# Patient Record
Sex: Female | Born: 1980 | Race: White | Hispanic: No | Marital: Married | Smoking: Former smoker
Health system: Southern US, Community
[De-identification: ages and names within clinical notes are randomized; demographics above are authoritative.]

## PROBLEM LIST (undated history)

## (undated) ENCOUNTER — Inpatient Hospital Stay: Payer: Self-pay

## (undated) DIAGNOSIS — Z789 Other specified health status: Secondary | ICD-10-CM

## (undated) HISTORY — PX: EYE SURGERY: SHX253

## (undated) HISTORY — PX: LEEP: SHX91

---

## 2009-02-24 ENCOUNTER — Encounter: Admission: RE | Admit: 2009-02-24 | Discharge: 2009-02-24 | Payer: Self-pay | Admitting: Obstetrics and Gynecology

## 2012-05-15 ENCOUNTER — Ambulatory Visit: Payer: Self-pay | Admitting: Unknown Physician Specialty

## 2012-07-24 ENCOUNTER — Ambulatory Visit: Payer: Self-pay | Admitting: Obstetrics & Gynecology

## 2013-02-17 ENCOUNTER — Observation Stay: Payer: Self-pay

## 2013-03-18 ENCOUNTER — Inpatient Hospital Stay: Payer: Self-pay

## 2013-03-18 ENCOUNTER — Encounter: Payer: Self-pay | Admitting: Obstetrics and Gynecology

## 2013-03-18 LAB — CBC WITH DIFFERENTIAL/PLATELET
Basophil #: 0 10*3/uL (ref 0.0–0.1)
Eosinophil #: 0 10*3/uL (ref 0.0–0.7)
Eosinophil %: 0 %
HGB: 12.8 g/dL (ref 12.0–16.0)
MCH: 30.8 pg (ref 26.0–34.0)
Monocyte #: 1.2 x10 3/mm — ABNORMAL HIGH (ref 0.2–0.9)
Monocyte %: 6.6 %
Neutrophil %: 89 %
RBC: 4.16 10*6/uL (ref 3.80–5.20)
WBC: 18.4 10*3/uL — ABNORMAL HIGH (ref 3.6–11.0)

## 2015-05-09 NOTE — H&P (Signed)
L&D Evaluation:  History:  HPI 34yo G1 at 7538w0d with didi twins in precipitous preterm labor.  Twin A is female and has been transverse, Twin B is female and has been transverse. [facility] for [facility]. Most recent U/S ~5wks ago and showed baby B 3oz larger than baby A and each around 2lbs (all per pt report).   Presents with contractions   Patient's Medical History Obesity, Cervical dysplasia, allergic rhinitis   Patient's Surgical History LEEP  colposcopy, LASIK   Medications Pre Natal Vitamins  Zyrtec, nasal sprays (Rhinocort)   Allergies NKDA   Social History none  Former smoker   Family History Non-Contributory   ROS:  ROS All systems were reviewed.  HEENT, CNS, GI, GU, Respiratory, CV, Renal and Musculoskeletal systems were found to be normal., see HPI   Exam:  Vital Signs stable   Urine Protein not completed   General no apparent distress   Mental Status clear   Chest normal effort   Abdomen gravid, non-tender   Fetal Position Twin A cephalic   Edema +1 to +2 edema in LE   Pelvic no external lesions, C/C/+4   Mebranes Ruptured   Description clear   FHT +FHTs for both babies prior to delivery   Ucx every 1-2 min   Skin dry   Impression:  Impression G1 at 5738w0d with didi twins in precip preterm labor.   Plan:  Comments Quick discussion with pt re: risks/benefits of vaginal delivery (including breech extraction of twin B if indicated) vs. C/S.  Anesthesia not readily present for precipitous nature of delivery.  Mutual decision made to proceed with vaginal delivery and pt moved to OR for double set up.   Electronic Signatures: Garnette GunnerStansbury Clipp, Ali LoweEryn K (MD)  (Signed 20-Mar-14 04:57)  Authored: L&D Evaluation   Last Updated: 20-Mar-14 04:57 by Garnette GunnerStansbury Clipp, Ali LoweEryn K (MD)

## 2015-05-09 NOTE — H&P (Signed)
L&D Evaluation:  History:  HPI 34 year old G1 p0 with DiDi twins and EDC=05/21/2013 presents at 5826 4/7 weeks with c/o LOF x3 days and decreased FM of twin A today. LOF increased today-has changed 1 panntiliner today. Had some irritation with discharge last night. No bleeding. Twin A is female and has been transverse, Twin B is female and has been transverse. [facility] notable for obesity. Has recently seen DP and ultrasound last week showed concordant growth of twins.   Presents with decreased fetal movement, leaking fluid   Patient's Medical History Obesity, Cervical dysplasia, allergic rhinitis   Patient's Surgical History LEEP  colposcopy, LASIK   Medications Pre Natal Vitamins  Zyrtec, nasal sprays (Rhinocort)   Allergies NKDA   Social History none  Former smoker   Family History Non-Contributory   ROS:  ROS see HPI   Exam:  Vital Signs 139/73   Urine Protein not completed   General no apparent distress   Mental Status clear   Abdomen gravid, non-tender   Fetal Position Twin A-cephalic, Twin B-transverse (superior)   Edema +1 to +2 edema in LE   Pelvic no external lesions, SSE: negative Nitrazine, neg fern. Wet prep negative.   Mebranes Intact   FHT Twin A-RLQ-135 with accels to 150s. Twin B -above umbilicus-140s baseline with accels to 160s   FHT Description mod variability   Ucx initially frequent q1-3 min apart, then contractions spaced out with po hydration. Patient did not feel the contractions   Skin dry   Other SDPTWIN A=4.38cm, Twin B SDP=6.90cm. FM noted in both twins on US   Impression:  Impression DiDI twins with reactive NST x2 at 26 4/7 weeks. No evidence of PROM or vaginitis.   Plan:  Plan DC home . FU as scheduled at office in 1 week.   Electronic Signatures: Trinna BalloonGutierrez, Everlee Quakenbush L (CNM)  (Signed 20-Feb-14 06:08)  Authored: L&D Evaluation   Last Updated: 20-Feb-14 06:08 by Trinna BalloonGutierrez, Oksana Deberry L (CNM)

## 2015-08-22 ENCOUNTER — Other Ambulatory Visit: Payer: Self-pay | Admitting: Obstetrics and Gynecology

## 2015-08-22 DIAGNOSIS — Z1231 Encounter for screening mammogram for malignant neoplasm of breast: Secondary | ICD-10-CM

## 2015-08-23 ENCOUNTER — Ambulatory Visit
Admission: RE | Admit: 2015-08-23 | Discharge: 2015-08-23 | Disposition: A | Discharge status: Home / Self Care | Payer: Federal, State, Local not specified - PPO | Source: Ambulatory Visit | Attending: Obstetrics and Gynecology | Admitting: Obstetrics and Gynecology

## 2015-08-23 DIAGNOSIS — Z1231 Encounter for screening mammogram for malignant neoplasm of breast: Secondary | ICD-10-CM | POA: Insufficient documentation

## 2015-12-31 NOTE — L&D Delivery Note (Signed)
Delivery Note Patient's last menstrual period was 01/06/2016. EGA: 40.0  At 3:23 AM a viable female was delivered via Vaginal, Spontaneous Delivery (Presentation: ROA;).  APGAR: 8, 9; weight 8 lb 1.1 oz (3660 g).   Placenta status: spontaneous, intact.  Cord: 3 vessels,  with the following complications: none apparent.  Cord pH: n/a  Anesthesia:  epidural Episiotomy:  none Lacerations:  Right labial Suture Repair: 3.0 vicryl Est. Blood Loss (mL):  200cc  Mom to postpartum.  Baby to Couplet care / Skin to Skin.  [facility], s/p cytotecx 2 then AROM.  Epidural placed then rapid dilation/descent and minutes-long 2nd stage while bed was being set up.  One push.  Loose body cord.  Baby was delivered and placed on mom's chest.  Cord left intact until pulseless, then clamped then cut by FOB.  Cord blood collected. Spontaneous intact placenta. Right superficial labial tear, repaired with 3-0 vicryl and hemostatic.    We sang Happy [place] OuchBirthday to this as-of-yet unnamed baby girl.  Jacquese Hackman C Taje Tondreau 10/12/2016, 4:01 AM

## 2016-03-14 ENCOUNTER — Other Ambulatory Visit: Payer: Self-pay | Admitting: Obstetrics and Gynecology

## 2016-03-14 DIAGNOSIS — Z369 Encounter for antenatal screening, unspecified: Secondary | ICD-10-CM

## 2016-04-04 ENCOUNTER — Ambulatory Visit (HOSPITAL_BASED_OUTPATIENT_CLINIC_OR_DEPARTMENT_OTHER)
Admission: RE | Admit: 2016-04-04 | Discharge: 2016-04-04 | Disposition: A | Discharge status: Home / Self Care | Payer: Federal, State, Local not specified - PPO | Source: Ambulatory Visit | Attending: Maternal & Fetal Medicine | Admitting: Maternal & Fetal Medicine

## 2016-04-04 ENCOUNTER — Ambulatory Visit
Admission: RE | Admit: 2016-04-04 | Discharge: 2016-04-04 | Disposition: A | Discharge status: Home / Self Care | Payer: Federal, State, Local not specified - PPO | Source: Ambulatory Visit | Attending: Maternal & Fetal Medicine | Admitting: Maternal & Fetal Medicine

## 2016-04-04 VITALS — BP 121/77 | HR 87 | Temp 98.2°F | Resp 18 | Wt 234.4 lb

## 2016-04-04 DIAGNOSIS — N83201 Unspecified ovarian cyst, right side: Secondary | ICD-10-CM | POA: Diagnosis not present

## 2016-04-04 DIAGNOSIS — O09521 Supervision of elderly multigravida, first trimester: Secondary | ICD-10-CM | POA: Diagnosis not present

## 2016-04-04 DIAGNOSIS — O3481 Maternal care for other abnormalities of pelvic organs, first trimester: Secondary | ICD-10-CM | POA: Diagnosis not present

## 2016-04-04 DIAGNOSIS — Z3A12 12 weeks gestation of pregnancy: Secondary | ICD-10-CM | POA: Insufficient documentation

## 2016-04-04 DIAGNOSIS — Z369 Encounter for antenatal screening, unspecified: Secondary | ICD-10-CM

## 2016-04-04 DIAGNOSIS — O09529 Supervision of elderly multigravida, unspecified trimester: Secondary | ICD-10-CM | POA: Insufficient documentation

## 2016-04-04 DIAGNOSIS — Z36 Encounter for antenatal screening of mother: Secondary | ICD-10-CM | POA: Insufficient documentation

## 2016-04-04 DIAGNOSIS — O09511 Supervision of elderly primigravida, first trimester: Secondary | ICD-10-CM | POA: Insufficient documentation

## 2016-04-04 HISTORY — DX: Other specified health status: Z78.9

## 2016-04-04 NOTE — Progress Notes (Signed)
I agree with assessment and plan as outlined in CGC Wells's note.  

## 2016-04-04 NOTE — Progress Notes (Signed)
Referring Provider:   [facility] Length of Consultation: 50 minutes  Ms. Leanna BattlesMalone was [facility] for genetic counseling because of advanced maternal age.  The patient will be 35 years old at the time of delivery.  This note summarizes the information we discussed.    We explained that the chance of a chromosome abnormality increases with maternal age.  Chromosomes and examples of chromosome problems were reviewed.  Humans typically have 46 chromosomes in each cell, with half passed through each sperm and egg.  Any change in the number or structure of chromosomes can increase the risk of problems in the physical and mental development of a pregnancy.   Based upon age of the patient, the chance of any chromosome abnormality was 1 in 2487. The chance of Down syndrome, the most common chromosome problem associated with maternal age, was 1 in 40175.  The risk of chromosome problems is in addition to the 3% general population risk for birth defects and mental retardation.  The greatest chance, of course, is that the baby would be born in good health.  We discussed the following prenatal screening and testing options for this pregnancy:  First trimester screening, which includes nuchal translucency ultrasound screen and first trimester maternal serum marker screening.  The nuchal translucency has approximately an 80% detection rate for Down syndrome and can be positive for other chromosome abnormalities as well as heart defects.  When combined with a maternal serum marker screening, the detection rate is up to 90% for Down syndrome and up to 97% for trisomy 18.     The chorionic villus sampling procedure is available for first trimester chromosome analysis.  This involves the withdrawal of a small amount of chorionic villi (tissue from the developing placenta).  Risk of pregnancy loss is estimated to be approximately 1 in 200 to 1 in 100 (0.5 to 1%).  There is approximately  a 1% (1 in 100) chance that the CVS chromosome results will be unclear.  Chorionic villi cannot be tested for neural tube defects.     Maternal serum marker screening, a blood test that measures pregnancy proteins, can provide risk assessments for Down syndrome, trisomy 18, and open neural tube defects (spina bifida, anencephaly). Because it does not directly examine the fetus, it cannot positively diagnose or rule out these problems.  Targeted ultrasound uses high frequency sound waves to create an image of the developing fetus.  An ultrasound is often recommended as a routine means of evaluating the pregnancy.  It is also used to screen for fetal anatomy problems (for example, a heart defect) that might be suggestive of a chromosomal or other abnormality.   Amniocentesis involves the removal of a small amount of amniotic fluid from the sac surrounding the fetus with the use of a thin needle inserted through the maternal abdomen and uterus.  Ultrasound guidance is used throughout the procedure.  Fetal cells from amniotic fluid are directly evaluated and > 99.5% of chromosome problems and > 98% of open neural tube defects can be detected. This procedure is generally performed after the 15th week of pregnancy.  The main risks to this procedure include complications leading to miscarriage in less than 1 in 200 cases (0.5%).  We also reviewed the availability of cell free fetal DNA testing from maternal blood to determine whether or not the baby may have either Down syndrome, trisomy 5213, or trisomy 7818.  This test utilizes a maternal blood sample and DNA  sequencing technology to isolate circulating cell free fetal DNA from maternal plasma.  The fetal DNA can then be analyzed for DNA sequences that are derived from the three most common chromosomes involved in aneuploidy, chromosomes 13, 18, and 21.  If the overall amount of DNA is greater than the expected level for any of these chromosomes, aneuploidy is  suspected.  While we do not consider it a replacement for invasive testing and karyotype analysis, a negative result from this testing would be reassuring, though not a guarantee of a normal chromosome complement for the baby.  An abnormal result is certainly suggestive of an abnormal chromosome complement, though we would still recommend CVS or amniocentesis to confirm any findings from this testing.  Cystic Fibrosis and Spinal Muscular Atrophy (SMA) screening were also discussed with the patient. Both conditions are recessive, which means that both parents must be carriers in order to have a child with the disease.  Cystic fibrosis (CF) is one of the most common genetic conditions in persons of Caucasian ancestry.  This condition occurs in approximately 1 in 2,500 Caucasian persons and results in thickened secretions in the lungs, digestive, and reproductive systems.  For a baby to be at risk for having CF, both of the parents must be carriers for this condition.  Approximately 1 in 45 Caucasian persons is a carrier for CF.  Current carrier testing looks for the most common mutations in the gene for CF and can detect approximately 90% of carriers in the Caucasian population.  This means that the carrier screening can greatly reduce, but cannot eliminate, the chance for an individual to have a child with CF.  If an individual is found to be a carrier for CF, then carrier testing would be available for the partner. As part of [place]'s newborn screening profile, all babies born in the state of [place] will have a two-tier screening process.  Specimens are first tested to determine the concentration of immunoreactive trypsinogen (IRT).  The top 5% of specimens with the highest IRT values then undergo DNA testing using a panel of over 40 common CF mutations. SMA is a neurodegenerative disorder that leads to atrophy of skeletal muscle and overall weakness. SMA is also inherited as a recessive condition,  meaning that both parents must be carriers in order to have a baby with the condition. This condition is also more prevalent in the Caucasian population, with 1 in 40-1 in 60 persons being a carrier and 1 in 6,000-1 in 10,000 children being affected.  There are multiple forms of the disease, with some causing death in infancy and other forms with survival into adulthood.  The genetics of SMA is complex, but carrier screening can detect up to 95% of carriers in the Caucasian population.  Similar to CF, a negative result can greatly reduce, but cannot eliminate, the chance to have a child with SMA.  We obtained a detailed family history and pregnancy history.  Ms. Ramanathan reported that this is her second pregnancy.   She and her husband have twins, a boy and girl, who are three years old and in good health.  They were born at 31 weeks following a very rapid labor.  Both are growing and developing normally.  The patient reported that one of her sisters has a 32 year old daughter with health concerns.  She has a condition in which her body "attacks her muscles" and she has adverse reactions to anesthesia.  She is said to have  been a late walker and be very tall because her muscles do not constrict properly. Ms. Korpela could not recall the name of the condition but felt like the family was told it came from the child's father's family.  We encouraged her to find out more information, particularly the name of the condition, and let us know if she would like to discuss this more.  Without additional information, it is difficult to accurately assess the chance for other family members, including this pregnancy, to be affected with this condition.  Ms. Abrams also reported that her mother and sister both had several miscarriages. Her mother went on to have four healthy children and her sister has 5 children.  The reason for those losses was not known.  We reviewed that there may be many reasons for miscarriage, including  clotting disorders, structural uterine anomalies, chromosome rearrangements and other causes.  Without knowledge of the cause, it is difficult to determine the level of concern for other family members.  Because the patient has not experienced any losses and we do not know the cause for those losses, we would not recommend any additional testing at this time. The remainder of the family history is unremarkable for birth defects, mental retardation, recurrent pregnancy loss or known chromosome abnormalities.  Ms. Heaton reported no complications or exposures that would be expected to increase the risk for birth defects.  After consideration of the options, Ms. Lightsey elected to proceed with an ultrasound, InformaSeq testing, CF and SMA carrier screening.  Labs were drawn today and results should be available within 2 weeks.  An ultrasound was performed at the time of the visit.  The gestational age was consistent with  12 weeks.  Fetal anatomy could not be assessed due to early gestational age.  Please refer to the ultrasound report for details of that study.  Ms. Toelle was encouraged to call with questions or concerns.  We can be contacted at [phone].   Cherly Anderson, MS, CGC

## 2016-04-09 LAB — MISC LABCORP TEST (SEND OUT): Labcorp test code: 450010

## 2016-04-11 LAB — INFORMASEQ(SM) WITH XY ANALYSIS
Fetal Fraction (%):: 6.1
Fetal Number: 1
Gestational Age at Collection: 12.7 weeks
Weight: 234 [lb_av]

## 2016-04-11 LAB — CYSTIC FIBROSIS GENE TEST

## 2016-04-15 ENCOUNTER — Telehealth: Payer: Self-pay | Admitting: Obstetrics and Gynecology

## 2016-04-15 NOTE — Telephone Encounter (Signed)
Ms. Alicia Burnett elected to have testing for chromosome conditions, CF carrier screening and SMA carrier screening performed at [facility] on 04/04/2016.  Those results are normal, as shown below.  We have left a message for the patient to call us back to discuss the results.  The patient was informed of the results of her recent InformaSeq testing (performed at [facility]) which yielded NEGATIVE results.  The patient's specimen showed DNA consistent with two copies of chromosomes 21, 18 and 13.  The sensitivity for trisomy 6121, trisomy 4718 and trisomy 8613 using this testing are reported as 99.1%, 98.3% and 98.1% respectively.  Thus, while the results of this testing are highly accurate, they are not considered diagnostic at this time.  Should more definitive information be desired, the patient may still consider amniocentesis. As requested to know by the patient, sex chromosome analysis was included for this sample.  Results was consistent with a female fetus (XX). This is predicted with >97% accuracy.  A maternal serum AFP only should be considered if screening for neural tube defects is desired.  CF is a genetic condition that occurs most often in Caucasian persons.  It primarily affects the lungs, digestive, and reproductive systems.  For someone to be at risk for having CF, both of their parents must be carriers for CF.  The testing can detect many persons who are carriers for CF and therefore determine if the pregnancy is at an increased risk for this condition.  The blood test results were negative when examined for the 32 most common mutations (or changes) in the gene for CF.  This means that she does not carry any of the most common changes in this gene.  Testing for these 32 mutations detects approximately 90% of carriers who are Caucasian.  Therefore, the chance that she is a carrier based on this negative result has been reduced from 1 in 25 to approximately 1 in 240.  Because this  testing cannot detect all changes that may cause CF, we cannot eliminate the chance that this individual is a carrier completely.  Because these results are negative, we do not recommend additional testing for her partner.  The results of the SMA carrier screening are also available.  SMA is also a recessive genetic condition with variable age of onset and severity caused by mutations in the SMN1 gene.  This carrier testing assesses the number of copies of this gene.  Persons with one copy of the SMN1 gene are carriers.  Individuals with two or more copies have a reduced chance to be a carrier.  Not all mutations can be detected with this testing, though it can detect 94.8% of carriers in the Caucasian population.  The results revealed that Ms. Alicia Burnett has an SMN1 copy number of 2, thus reducing her chance to be a carrier from 1 in 547 to 1 in 834.  Again, this testing cannot eliminate the chance to have a child with SMA, but dramatically reduces the chance.    If there are any questions or concerns, please feel free to contact our office at [phone].    Cherly Andersoneborah F. Tyleigh Mahn, MS, CGC

## 2016-07-15 ENCOUNTER — Inpatient Hospital Stay
Admission: EM | Admit: 2016-07-15 | Discharge: 2016-07-15 | Disposition: A | Discharge status: Home / Self Care | Payer: Federal, State, Local not specified - PPO | Attending: Obstetrics and Gynecology | Admitting: Obstetrics and Gynecology

## 2016-07-15 DIAGNOSIS — K6289 Other specified diseases of anus and rectum: Secondary | ICD-10-CM | POA: Insufficient documentation

## 2016-07-15 DIAGNOSIS — O09522 Supervision of elderly multigravida, second trimester: Secondary | ICD-10-CM

## 2016-07-15 DIAGNOSIS — Z3A27 27 weeks gestation of pregnancy: Secondary | ICD-10-CM | POA: Diagnosis not present

## 2016-07-15 DIAGNOSIS — O26892 Other specified pregnancy related conditions, second trimester: Secondary | ICD-10-CM | POA: Diagnosis not present

## 2016-07-15 DIAGNOSIS — Z87891 Personal history of nicotine dependence: Secondary | ICD-10-CM | POA: Diagnosis not present

## 2016-07-15 NOTE — Discharge Instructions (Signed)
Keep scheduled appointment, call with any questions or concerns

## 2016-07-15 NOTE — Progress Notes (Signed)
Discharge note: 35 yo G3P0102 with EDD of 10/12/16 dated by LMP of 01/06/16. Pt has a LEEP hx in 1998 and had spont DiDi twins in 2014 and del at 31 weeks and is worried that she feels rectal pressure and came in to be checked. Past Medical History  Diagnosis Date  . Medical history non-contributory    Past Surgical History  Procedure Laterality Date  . Leep    . Eye surgery  Lasix  History reviewed. No pertinent family history.  Social History   Social History  . Marital Status: Married    Spouse Name: N/A  . Number of Children: N/A  . Years of Education: N/A   Occupational History  . Not on file.   Social History Main Topics  . Smoking status: Former Smoker    Quit date: 06/29/2009  . Smokeless tobacco: Never Used  . Alcohol Use: No  . Drug Use: No  . Sexual Activity: Yes   Other Topics Concern  . Not on file   Social History Narrative  O: Gen: 35 yo white female in MAD. NST reactive with 2 accels 15 x 15 BPM reactive. Cx: closed/0% VSS. Reassurance to pt given. A: IUP at 27 4/7 weeks  2. Rectal pressure P: NST reactive. 2. Reassured pt via RN that cx is closed and No UC's seen. 3. FU as scheduled or if needed.

## 2016-07-15 NOTE — OB Triage Note (Signed)
[facility] after seeing Carlean JewsMeredith Sigmon, CNM due to pt experiencing contractions over the weekend.  Per pt, sent over for prolonged monitoring.

## 2016-07-16 NOTE — Discharge Summary (Signed)
Patient Information    Patient Name Sex DOB SSN   Maurilio LovelyMalone, Alicia M Female [birth year] [id number]    Progress Notes by Sharee Pimplearon W Chaya Dehaan, CNM at 07/15/2016 1:53 PM    Author: Sharee Pimplearon W Laurielle Selmon, CNM Service: Gynecology Author Type: Certified Midwife   Filed: 07/15/2016 1:58 PM Note Time: 07/15/2016 1:53 PM Status: Signed   Editor: Sharee Pimplearon W Reesha Debes, CNM (Certified Midwife)     Expand All Collapse All   Discharge note: 35 yo [id number] with EDD of 10/12/16 dated by LMP of 01/06/16. Pt has a LEEP hx in 1998 and had spont DiDi twins in 2014 and del at 31 weeks and is worried that she feels rectal pressure and came in to be checked. Past Medical History  Diagnosis Date  . Medical history non-contributory    Past Surgical History  Procedure Laterality Date  . Leep    . Eye surgery  Lasix  History reviewed. No pertinent family history.  Social History   Social History  . Marital Status: Married    Spouse Name: N/A  . Number of Children: N/A  . Years of Education: N/A   Occupational History  . Not on file.   Social History Main Topics  . Smoking status: Former Smoker    Quit date: 06/29/2009  . Smokeless tobacco: Never Used  . Alcohol Use: No  . Drug Use: No  . Sexual Activity: Yes   Other Topics Concern  . Not on file   Social History Narrative  O: Gen: 35 yo white female in MAD. NST reactive with 2 accels 15 x 15 BPM reactive. Cx: closed/0% VSS. Reassurance to pt given. A: IUP at 27 4/7 weeks  2. Rectal pressure P: NST reactive. 2. Reassured pt via RN that cx is closed and No UC's seen. 3. FU as scheduled or if needed.

## 2016-08-21 ENCOUNTER — Encounter
Admission: RE | Admit: 2016-08-21 | Discharge: 2016-08-21 | Disposition: A | Discharge status: Home / Self Care | Payer: Federal, State, Local not specified - PPO | Source: Ambulatory Visit | Attending: Obstetrics and Gynecology | Admitting: Obstetrics and Gynecology

## 2016-08-21 NOTE — Pre-Procedure Instructions (Signed)
I do not need to see the patient unless their BMI is >45.  She is free to go.  Per Dr Randa NgoPiscitello.

## 2016-10-11 ENCOUNTER — Inpatient Hospital Stay
Admission: EM | Admit: 2016-10-11 | Discharge: 2016-10-13 | DRG: 775 | Disposition: A | Discharge status: Home / Self Care | Payer: Federal, State, Local not specified - PPO | Attending: Obstetrics & Gynecology | Admitting: Obstetrics & Gynecology

## 2016-10-11 ENCOUNTER — Other Ambulatory Visit: Payer: Self-pay | Admitting: Obstetrics and Gynecology

## 2016-10-11 ENCOUNTER — Encounter: Payer: Self-pay | Admitting: *Deleted

## 2016-10-11 DIAGNOSIS — Z3A40 40 weeks gestation of pregnancy: Secondary | ICD-10-CM

## 2016-10-11 DIAGNOSIS — O134 Gestational [pregnancy-induced] hypertension without significant proteinuria, complicating childbirth: Secondary | ICD-10-CM | POA: Diagnosis present

## 2016-10-11 DIAGNOSIS — Z87891 Personal history of nicotine dependence: Secondary | ICD-10-CM

## 2016-10-11 DIAGNOSIS — E669 Obesity, unspecified: Secondary | ICD-10-CM | POA: Diagnosis present

## 2016-10-11 DIAGNOSIS — Z6841 Body Mass Index (BMI) 40.0 and over, adult: Secondary | ICD-10-CM | POA: Diagnosis not present

## 2016-10-11 DIAGNOSIS — O139 Gestational [pregnancy-induced] hypertension without significant proteinuria, unspecified trimester: Secondary | ICD-10-CM | POA: Diagnosis present

## 2016-10-11 DIAGNOSIS — O99214 Obesity complicating childbirth: Secondary | ICD-10-CM | POA: Diagnosis present

## 2016-10-11 LAB — TYPE AND SCREEN
ABO/RH(D): O POS
Antibody Screen: NEGATIVE

## 2016-10-11 LAB — CBC
HCT: 39.1 % (ref 35.0–47.0)
Hemoglobin: 13.8 g/dL (ref 12.0–16.0)
MCH: 31.6 pg (ref 26.0–34.0)
MCHC: 35.1 g/dL (ref 32.0–36.0)
MCV: 89.8 fL (ref 80.0–100.0)
PLATELETS: 169 10*3/uL (ref 150–440)
RBC: 4.36 MIL/uL (ref 3.80–5.20)
RDW: 15.2 % — ABNORMAL HIGH (ref 11.5–14.5)
WBC: 7.8 10*3/uL (ref 3.6–11.0)

## 2016-10-11 LAB — PROTEIN / CREATININE RATIO, URINE: CREATININE, URINE: 143 mg/dL

## 2016-10-11 MED ORDER — OXYTOCIN 40 UNITS IN LACTATED RINGERS INFUSION - SIMPLE MED
2.5000 [IU]/h | INTRAVENOUS | Status: DC
  Filled 2016-10-11: qty 1000

## 2016-10-11 MED ORDER — DEXTROSE 5 % IV SOLN
5.0000 10*6.[IU] | Freq: Once | INTRAVENOUS | Status: AC
  Administered 2016-10-11: 5 10*6.[IU] via INTRAVENOUS
  Filled 2016-10-11: qty 5

## 2016-10-11 MED ORDER — LACTATED RINGERS IV SOLN
500.0000 mL | INTRAVENOUS | Status: DC | PRN
  Administered 2016-10-11: 500 mL via INTRAVENOUS

## 2016-10-11 MED ORDER — BUTORPHANOL TARTRATE 1 MG/ML IJ SOLN
1.0000 mg | INTRAMUSCULAR | Status: DC | PRN
  Administered 2016-10-12: 1 mg via INTRAVENOUS
  Filled 2016-10-11: qty 1

## 2016-10-11 MED ORDER — ACETAMINOPHEN 325 MG PO TABS: 650.0000 mg | ORAL_TABLET | ORAL | Status: DC | PRN

## 2016-10-11 MED ORDER — AMMONIA AROMATIC IN INHA
RESPIRATORY_TRACT | Status: AC
  Filled 2016-10-11: qty 10

## 2016-10-11 MED ORDER — ONDANSETRON HCL 4 MG/2ML IJ SOLN: 4.0000 mg | Freq: Four times a day (QID) | INTRAMUSCULAR | Status: DC | PRN

## 2016-10-11 MED ORDER — OXYTOCIN 10 UNIT/ML IJ SOLN
INTRAMUSCULAR | Status: AC
  Filled 2016-10-11: qty 2

## 2016-10-11 MED ORDER — LABETALOL HCL 5 MG/ML IV SOLN
20.0000 mg | INTRAVENOUS | Status: DC | PRN
  Filled 2016-10-11: qty 16

## 2016-10-11 MED ORDER — HYDRALAZINE HCL 20 MG/ML IJ SOLN: 10.0000 mg | Freq: Once | INTRAMUSCULAR | Status: DC | PRN

## 2016-10-11 MED ORDER — MISOPROSTOL 200 MCG PO TABS
ORAL_TABLET | ORAL | Status: AC
  Administered 2016-10-11: 25 ug via BUCCAL
  Filled 2016-10-11: qty 4

## 2016-10-11 MED ORDER — PENICILLIN G POTASSIUM 5000000 UNITS IJ SOLR
2.5000 10*6.[IU] | INTRAVENOUS | Status: DC
  Administered 2016-10-11 – 2016-10-12 (×2): 2.5 10*6.[IU] via INTRAVENOUS
  Filled 2016-10-11 (×12): qty 2.5

## 2016-10-11 MED ORDER — LACTATED RINGERS IV SOLN
INTRAVENOUS | Status: DC
  Administered 2016-10-11 – 2016-10-12 (×3): via INTRAVENOUS

## 2016-10-11 MED ORDER — OXYTOCIN BOLUS FROM INFUSION: 500.0000 mL | Freq: Once | INTRAVENOUS | Status: DC

## 2016-10-11 MED ORDER — LIDOCAINE HCL (PF) 1 % IJ SOLN: 30.0000 mL | INTRAMUSCULAR | Status: DC | PRN

## 2016-10-11 MED ORDER — TERBUTALINE SULFATE 1 MG/ML IJ SOLN: 0.2500 mg | Freq: Once | INTRAMUSCULAR | Status: DC | PRN

## 2016-10-11 MED ORDER — SOD CITRATE-CITRIC ACID 500-334 MG/5ML PO SOLN
30.0000 mL | ORAL | Status: DC | PRN
  Filled 2016-10-11: qty 30

## 2016-10-11 MED ORDER — MISOPROSTOL 25 MCG QUARTER TABLET
25.0000 ug | ORAL_TABLET | ORAL | Status: DC
  Administered 2016-10-11 (×2): 25 ug via BUCCAL
  Filled 2016-10-11 (×2): qty 1
  Filled 2016-10-11: qty 0.25
  Filled 2016-10-11 (×3): qty 1

## 2016-10-11 MED ORDER — LIDOCAINE HCL (PF) 1 % IJ SOLN
INTRAMUSCULAR | Status: AC
  Filled 2016-10-11: qty 30

## 2016-10-11 NOTE — H&P (Signed)
  OB ADMISSION/ HISTORY & PHYSICAL:  Admission Date: 10/11/16 Admit Diagnosis: Gestational Hypertension at 39+6 weeks   Alicia Burnett is a 35 y.o. female presenting for induction of labor for new onset gestational hypertension at 39+6 weeks.  She was seen in the office today with elevated BPs, 130/90 with a repeat of 146/88.  She has increased swelling in all extremities and face.  She has had an 8 pound weight gain in 2 weeks.    Prenatal History: [id number]   EDC : 10/12/2016, by Last Menstrual Period of 01/06/16 [facility] Prenatal course complicated by AMA, Hx. Of Mercie Eoni Di Twins with PTD at 31 weeks in 2014, hx. Of a LEEP procedure, proteinuria, GBS positive, BMI 44  Prenatal Labs: ABO, Rh:  O Positive Antibody:  Negative Rubella:   Immune Varicella: Immune RPR:   NR HBsAg:   Negative HIV:   negative GTT: 141, 3 hour WNL  GBS:   POSITIVE Flu vaccine: 10/04/16 Tdap vaccine: 07/18/16  Medical / Surgical History :  Past medical history:  Past Medical History:  Diagnosis Date  . Medical history non-contributory    GERD Seasonal allergies Obesity   Past surgical history:  Past Surgical History:  Procedure Laterality Date  . EYE SURGERY  Lasix  . LEEP      Family History: No family history on file.   Social History:  reports that she quit smoking about 7 years ago. She has never used smokeless tobacco. She reports that she does not drink alcohol or use drugs.   Allergies: Review of patient's allergies indicates no known allergies.    Current Medications at time of admission:  Prior to Admission medications   Medication Sig Start Date End Date Taking? Authorizing Provider  folic acid (FOLVITE) 800 MCG tablet Take 400 mcg by mouth daily.    Historical Provider, MD  Prenatal Vit-Fe Fumarate-FA (PRENATAL MULTIVITAMIN) TABS tablet Take 1 tablet by mouth daily at 12 noon.    Historical Provider, MD     Review of Systems: Active FM Irregular  ctxs Denies HA, visual disturbances, epigastric pain  No LOF  / SROM No bloody show   Physical Exam:  VS: Last menstrual period 01/06/2016.  General: alert and oriented, appears calm Heart: RRR Lungs: Clear lung fields Abdomen: Gravid, soft and non-tender, non-distended / uterus: gravid, non-tender, vtx. By Sara LeeLeopold's  Extremities: +2 BLE edema, +1 Upper extremity edema  Genitalia / VE:  0.5cm/50%/vtx   Assessment: 39+[redacted] weeks gestation Induction stage of labor Gestational Hypertension   Plan:  1. Admit to [facility] for induction     - Cytotec 25mcg buccal every 4 hours     - Foley bulb by Dr. Elesa MassedWard when appropriate    - Routine labor and delivery orders    - Stadol 1mg  every 1 hour PRN    - Epidural upon request    - Monitor BPs every hour     - Pre-ecl[facility] - pending  2. GBS Positive    - PCN  3. Contraception: unsure 4. Anticipate NSVD    - Proven pelvis to 4# (hx.of PTD with 31 week twins)  Dr. Elesa MassedWard notified of admission / plan of care  Carlean JewsMeredith Camary Sosa, CNM

## 2016-10-12 ENCOUNTER — Encounter: Payer: Self-pay | Admitting: Anesthesiology

## 2016-10-12 ENCOUNTER — Inpatient Hospital Stay: Payer: Federal, State, Local not specified - PPO | Admitting: Anesthesiology

## 2016-10-12 LAB — CBC
HEMATOCRIT: 38.8 % (ref 35.0–47.0)
Hemoglobin: 13.1 g/dL (ref 12.0–16.0)
MCH: 30.4 pg (ref 26.0–34.0)
MCHC: 33.8 g/dL (ref 32.0–36.0)
MCV: 89.8 fL (ref 80.0–100.0)
Platelets: 152 10*3/uL (ref 150–440)
RBC: 4.32 MIL/uL (ref 3.80–5.20)
RDW: 15.1 % — AB (ref 11.5–14.5)
WBC: 14.2 10*3/uL — ABNORMAL HIGH (ref 3.6–11.0)

## 2016-10-12 LAB — RPR: RPR Ser Ql: NONREACTIVE

## 2016-10-12 MED ORDER — WITCH HAZEL-GLYCERIN EX PADS: 1.0000 "application " | MEDICATED_PAD | CUTANEOUS | Status: DC | PRN

## 2016-10-12 MED ORDER — FENTANYL 2.5 MCG/ML W/ROPIVACAINE 0.2% IN NS 100 ML EPIDURAL INFUSION (ARMC-ANES)
EPIDURAL | Status: AC
  Filled 2016-10-12: qty 100

## 2016-10-12 MED ORDER — LACTATED RINGERS IV SOLN: 500.0000 mL | Freq: Once | INTRAVENOUS | Status: DC

## 2016-10-12 MED ORDER — COCONUT OIL OIL: 1.0000 "application " | TOPICAL_OIL | Status: DC | PRN

## 2016-10-12 MED ORDER — PHENYLEPHRINE 40 MCG/ML (10ML) SYRINGE FOR IV PUSH (FOR BLOOD PRESSURE SUPPORT)
80.0000 ug | PREFILLED_SYRINGE | INTRAVENOUS | Status: DC | PRN
  Filled 2016-10-12: qty 5

## 2016-10-12 MED ORDER — BUPIVACAINE HCL (PF) 0.25 % IJ SOLN
INTRAMUSCULAR | Status: DC | PRN
  Administered 2016-10-12: 5 mL via EPIDURAL

## 2016-10-12 MED ORDER — DIPHENHYDRAMINE HCL 50 MG/ML IJ SOLN: 12.5000 mg | INTRAMUSCULAR | Status: DC | PRN

## 2016-10-12 MED ORDER — SODIUM CHLORIDE FLUSH 0.9 % IV SOLN
INTRAVENOUS | Status: AC
  Filled 2016-10-12: qty 20

## 2016-10-12 MED ORDER — EPHEDRINE 5 MG/ML INJ
10.0000 mg | INTRAVENOUS | Status: DC | PRN
  Filled 2016-10-12: qty 2

## 2016-10-12 MED ORDER — DIBUCAINE 1 % RE OINT: 1.0000 "application " | TOPICAL_OINTMENT | RECTAL | Status: DC | PRN

## 2016-10-12 MED ORDER — ONDANSETRON HCL 4 MG PO TABS: 4.0000 mg | ORAL_TABLET | ORAL | Status: DC | PRN

## 2016-10-12 MED ORDER — PRENATAL MULTIVITAMIN CH
1.0000 | ORAL_TABLET | Freq: Every day | ORAL | Status: DC
Start: 2016-10-12 — End: 2016-10-13
  Administered 2016-10-12 – 2016-10-13 (×2): 1 via ORAL
  Filled 2016-10-12 (×2): qty 1

## 2016-10-12 MED ORDER — SIMETHICONE 80 MG PO CHEW: 80.0000 mg | CHEWABLE_TABLET | ORAL | Status: DC | PRN

## 2016-10-12 MED ORDER — TERBUTALINE SULFATE 1 MG/ML IJ SOLN: 0.2500 mg | Freq: Once | INTRAMUSCULAR | Status: DC | PRN

## 2016-10-12 MED ORDER — IBUPROFEN 600 MG PO TABS
600.0000 mg | ORAL_TABLET | Freq: Four times a day (QID) | ORAL | Status: DC
  Administered 2016-10-12 – 2016-10-13 (×5): 600 mg via ORAL
  Filled 2016-10-12 (×5): qty 1

## 2016-10-12 MED ORDER — DOCUSATE SODIUM 100 MG PO CAPS
100.0000 mg | ORAL_CAPSULE | Freq: Two times a day (BID) | ORAL | Status: DC
  Administered 2016-10-12 – 2016-10-13 (×2): 100 mg via ORAL
  Filled 2016-10-12 (×2): qty 1

## 2016-10-12 MED ORDER — OXYTOCIN 40 UNITS IN LACTATED RINGERS INFUSION - SIMPLE MED: 2.5000 [IU]/h | INTRAVENOUS | Status: DC

## 2016-10-12 MED ORDER — ONDANSETRON HCL 4 MG/2ML IJ SOLN: 4.0000 mg | INTRAMUSCULAR | Status: DC | PRN

## 2016-10-12 MED ORDER — ACETAMINOPHEN 500 MG PO TABS: 1000.0000 mg | ORAL_TABLET | Freq: Four times a day (QID) | ORAL | Status: DC | PRN

## 2016-10-12 MED ORDER — DIPHENHYDRAMINE HCL 25 MG PO CAPS: 25.0000 mg | ORAL_CAPSULE | Freq: Four times a day (QID) | ORAL | Status: DC | PRN

## 2016-10-12 NOTE — Anesthesia Postprocedure Evaluation (Signed)
Anesthesia Post Note  Patient: Alicia Burnett  Procedure(s) Performed: * No procedures listed *  Patient location during evaluation: Mother Baby Anesthesia Type: Epidural Level of consciousness: awake and alert Pain management: pain level controlled Vital Signs Assessment: post-procedure vital signs reviewed and stable Respiratory status: spontaneous breathing, nonlabored ventilation and respiratory function stable Cardiovascular status: stable Postop Assessment: no headache and no backache Anesthetic complications: no    Last Vitals:  Vitals:   10/12/16 1545 10/12/16 1603  BP: (!) 129/41 129/75  Pulse: 63 (!) 57  Resp: 18   Temp: 36.6 C     Last Pain:  Vitals:   10/12/16 1545  TempSrc: Oral  PainSc:                  KEPHART,WILLIAM K

## 2016-10-12 NOTE — Anesthesia Procedure Notes (Signed)
Epidural Patient location during procedure: OB  Staffing Anesthesiologist: Berdine AddisonHOMAS, Savas Elvin Performed: anesthesiologist   Preanesthetic Checklist Completed: patient identified, site marked, surgical consent, pre-op evaluation, timeout performed, IV checked, risks and benefits discussed and monitors and equipment checked  Epidural Patient position: sitting Prep: Betadine Patient monitoring: heart rate, continuous pulse ox and blood pressure Approach: midline Location: L4-L5 Injection technique: LOR saline  Needle:  Needle type: Tuohy  Needle gauge: 18 G Needle length: 9 cm and 9 Catheter type: closed end flexible Catheter size: 20 Guage Test dose: negative and 1.5% lidocaine with Epi 1:200 K  Assessment Sensory level: T10 Events: blood not aspirated, injection not painful, no injection resistance, negative IV test and no paresthesia  Additional Notes   Patient tolerated the insertion well without complications. [id number] catheter in. 0315 test dose. 0317 Bolus of 5 ml of marcaine. Infusion not started as the patient started pushing.Reason for block:procedure for pain

## 2016-10-12 NOTE — Anesthesia Preprocedure Evaluation (Signed)
Anesthesia Evaluation  Patient identified by MRN, date of birth, ID band Patient awake    Reviewed: Allergy & Precautions, NPO status , Patient's Chart, lab work & pertinent test results, reviewed documented beta blocker date and time   Airway Mallampati: III  TM Distance: >3 FB     Dental  (+) Chipped   Pulmonary former smoker,           Cardiovascular hypertension,      Neuro/Psych    GI/Hepatic   Endo/Other    Renal/GU      Musculoskeletal   Abdominal   Peds  Hematology   Anesthesia Other Findings   Reproductive/Obstetrics                             Anesthesia Physical Anesthesia Plan  ASA: III  Anesthesia Plan: Epidural   Post-op Pain Management:    Induction:   Airway Management Planned:   Additional Equipment:   Intra-op Plan:   Post-operative Plan:   Informed Consent: I have reviewed the patients History and Physical, chart, labs and discussed the procedure including the risks, benefits and alternatives for the proposed anesthesia with the patient or authorized representative who has indicated his/her understanding and acceptance.     Plan Discussed with: CRNA  Anesthesia Plan Comments:         Anesthesia Quick Evaluation

## 2016-10-12 NOTE — Discharge Summary (Signed)
Obstetrical Discharge Summary  Patient Name: Alicia Burnett DOB: 03-05-1981 MRN: [id number]  Date of Admission: 10/11/2016 Date of Discharge: 10/13/2016  Primary OB: [facility]  Gestational Age at Delivery: [redacted]w[redacted]d   Antepartum complications: GHTN, AMA, hx of PTD, hx LEEP, obesity Admitting Diagnosis:  IOL for GHTN at term Secondary Diagnosis: Patient Active Problem List   Diagnosis Date Noted  . Gestational hypertension 10/11/2016  . Advanced maternal age in multigravida 04/04/2016    Augmentation: AROM and Cytotec Complications: None Intrapartum complications/course: Mom for IOL due to [facility] at term, s/p cytotecx 2 then AROM.  Epidural placed then rapid dilation/descent and minutes-long 2nd stage while bed was being set up.  One push.  Loose body cord.  Baby was delivered and placed on mom's chest.  Cord left intact until pulseless, then clamped then cut by FOB.  Cord blood collected. Spontaneous intact placenta. Right superficial labial tear, repaired with 3-0 vicryl and hemostatic.   Date of Delivery: 10/12/16 Delivered By: Leeroy Bock Ward Delivery Type: spontaneous vaginal delivery Anesthesia: epidural Placenta: sponatneous Laceration: right labial Episiotomy: none Newborn Data: Live born female  Birth Weight: 8 lb 1.1 oz (3660 g) APGAR: 8, 9    Discharge Physical Exam:  BP 122/80 (BP Location: Right Arm)   Pulse 77   Temp 98.7 F (37.1 C) (Oral)   Resp 20   Ht 5\' 6"  (1.676 m)   Wt 271 lb (122.9 kg)   LMP 01/06/2016   SpO2 99%   Breastfeeding? Unknown   BMI 43.74 kg/m   General: NAD CV: RRR Pulm: CTABL, nl effort ABD: s/nd/nt, fundus firm and below the umbilicus Lochia: moderate DVT Evaluation: LE non-ttp, no evidence of DVT on exam.  Hemoglobin  Date Value Ref Range Status  10/12/2016 13.1 12.0 - 16.0 g/dL Final   HGB  Date Value Ref Range Status  03/18/2013 12.8 12.0 - 16.0 g/dL Final   HCT  Date Value Ref Range Status  10/12/2016 38.8 35.0 -  47.0 % Final  03/19/2013 33.9 (L) 35.0 - 47.0 % Final    Post partum course:  PPD#1: tolerating regular po diet, voiding spontaneously, pain controlled with po meds.  Postpartum Procedures: none Disposition: stable, discharge to home. Baby Feeding: breastmilk Baby Disposition: home with mom  Rh Immune globulin given: n/a Rubella vaccine given: no Tdap vaccine given in AP or PP setting: AP Flu vaccine given in AP or PP setting: AP  Contraception: TBD  Prenatal Labs:  Prenatal Labs: ABO, Rh:  O Positive Antibody:  Negative Rubella:   Immune Varicella: Immune RPR:   NR HBsAg:   Negative HIV:   negative GTT: 141, 3 hour WNL  GBS:   POSITIVE Flu vaccine: 10/04/16 Tdap vaccine: 07/18/16    Plan:  Maurilio Lovely was discharged to home in good condition. Follow-up appointment at [facility] with Dr Elesa Massed in 6 weeks   Discharge Medications:   Medication List    TAKE these medications   docusate sodium 100 MG capsule Commonly known as:  COLACE Take 1 capsule (100 mg total) by mouth daily as needed for mild constipation.   folic acid 800 MCG tablet Commonly known as:  FOLVITE Take 400 mcg by mouth daily.   ibuprofen 800 MG tablet Commonly known as:  ADVIL,MOTRIN Take 1 tablet (800 mg total) by mouth every 8 (eight) hours as needed for moderate pain or cramping.   ondansetron 4 MG disintegrating tablet Commonly known as:  ZOFRAN ODT Take 1 tablet (4  mg total) by mouth every 8 (eight) hours as needed for nausea or vomiting.   prenatal multivitamin Tabs tablet Take 1 tablet by mouth daily at 12 noon.         Signed: Christeen DouglasBEASLEY, Nonnie Pickney

## 2016-10-13 MED ORDER — ONDANSETRON 4 MG PO TBDP: 4.0000 mg | ORAL_TABLET | Freq: Three times a day (TID) | ORAL | 0 refills | Status: AC | PRN

## 2016-10-13 MED ORDER — DOCUSATE SODIUM 100 MG PO CAPS: 100.0000 mg | ORAL_CAPSULE | Freq: Every day | ORAL | 3 refills | Status: AC | PRN

## 2016-10-13 MED ORDER — IBUPROFEN 800 MG PO TABS: 800.0000 mg | ORAL_TABLET | Freq: Three times a day (TID) | ORAL | 0 refills | Status: AC | PRN

## 2016-10-13 NOTE — Progress Notes (Signed)
Post Partum Day 1 Subjective: no complaints, up ad lib, voiding, tolerating PO and no s/s PreE  Objective: Blood pressure 125/82, pulse 67, temperature 98 F (36.7 C), temperature source Oral, resp. rate 20, height 5\' 6"  (1.676 m), weight 271 lb (122.9 kg), last menstrual period 01/06/2016, SpO2 99 %, unknown if currently breastfeeding.  Physical Exam:  General: alert, cooperative, appears stated age and no distress Lochia: appropriate Uterine Fundus: firm DVT Evaluation: No evidence of DVT seen on physical exam. Negative Homan's sign.   Recent Labs  10/11/16 1739 10/12/16 0738  HGB 13.8 13.1  HCT 39.1 38.8    Assessment/Plan: Plan for discharge tomorrow and Breastfeeding   LOS: 2 days   Gayanne Prescott 10/13/2016, 2:59 PM

## 2016-10-13 NOTE — Progress Notes (Signed)
Patient discharged home with infant. Discharge instructions, prescriptions and follow up appointment given to and reviewed with patient. Patient verbalized understanding  

## 2017-02-08 IMAGING — MG MM DIGITAL SCREENING BILATERAL
4 series · 4 of 4 positions shown · non-contrast
Comparison: None.

CLINICAL DATA: Screening.

EXAM:
DIGITAL SCREENING BILATERAL MAMMOGRAM WITH CAD

[R MLO]
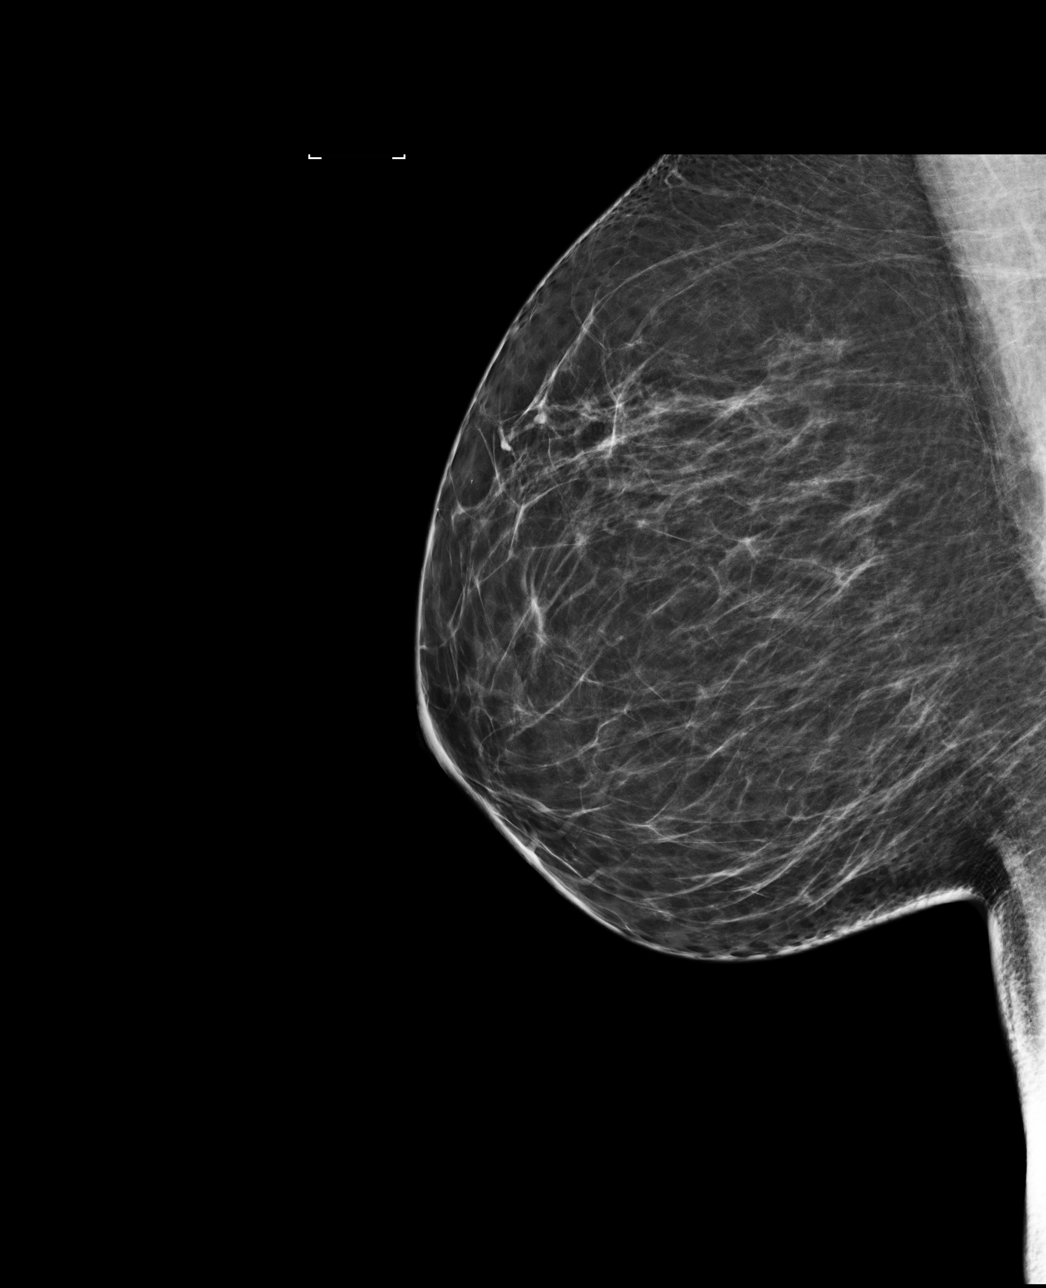

[R CC]
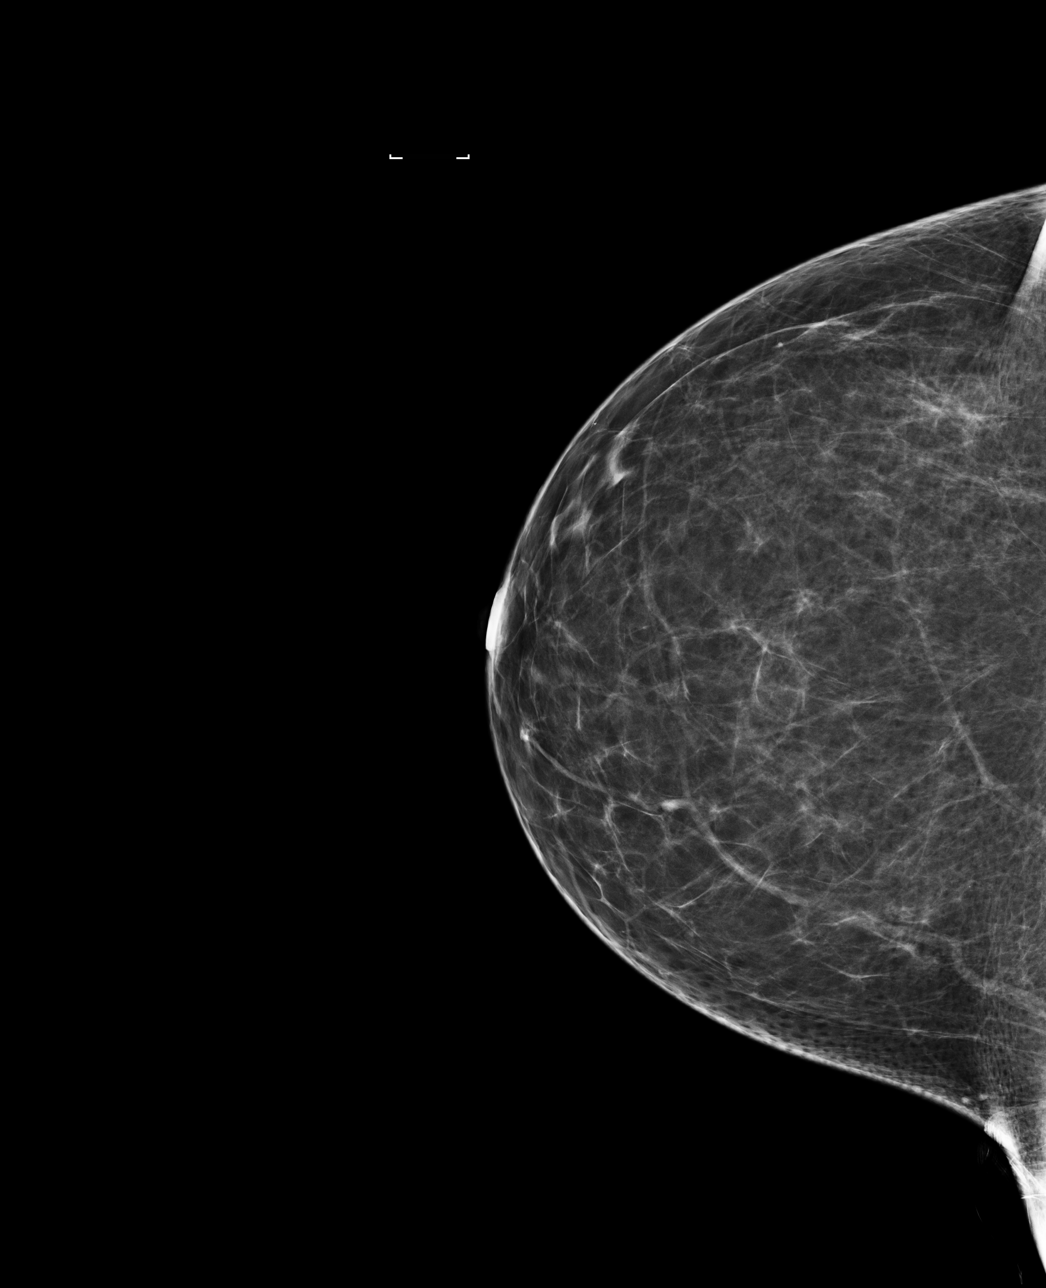

[L MLO]
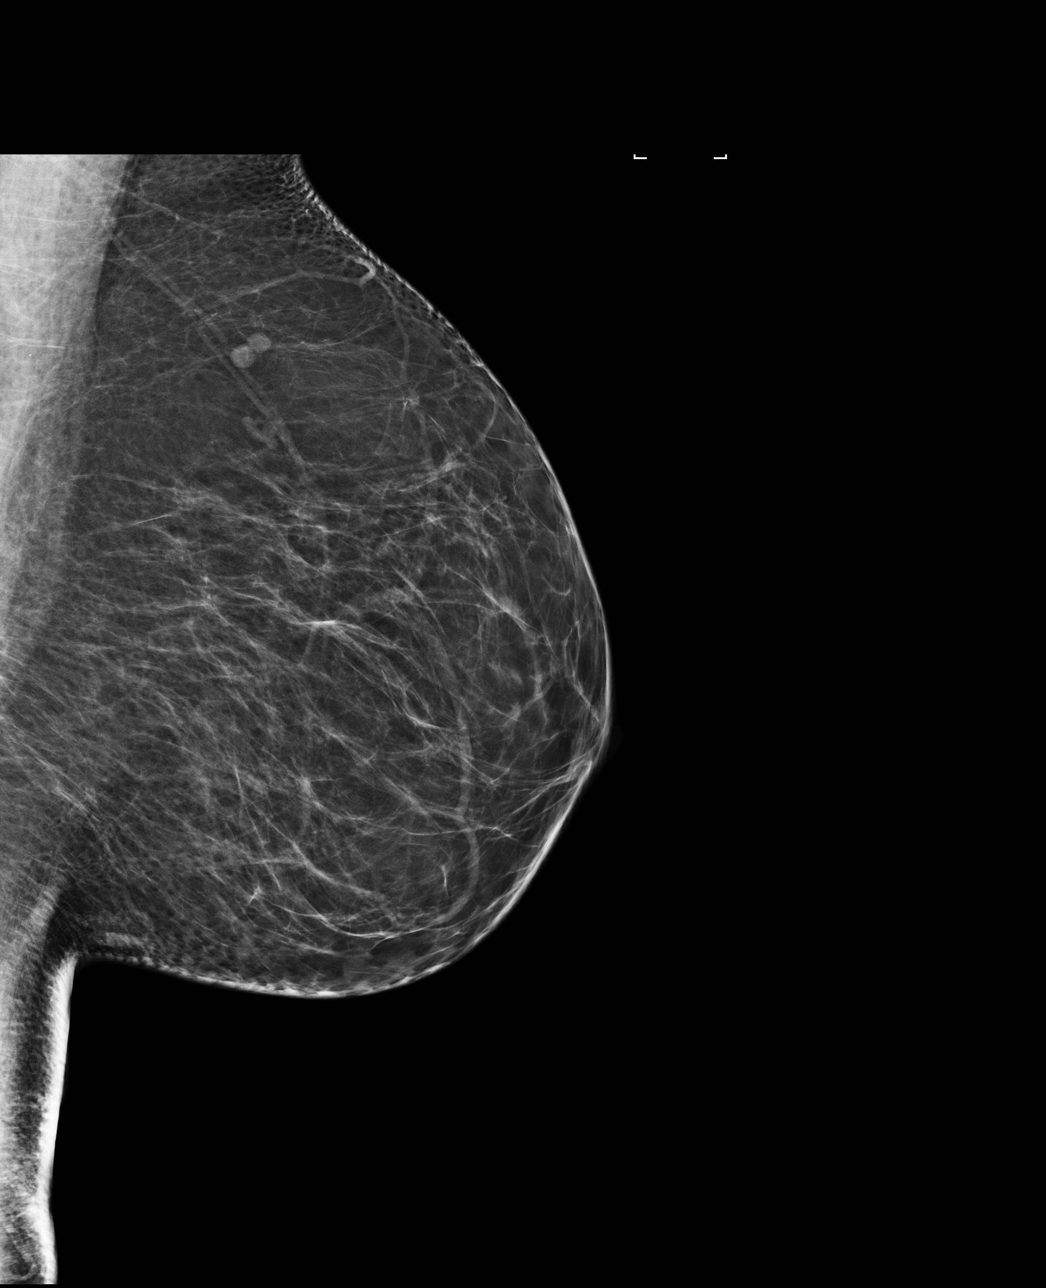

[L CC]
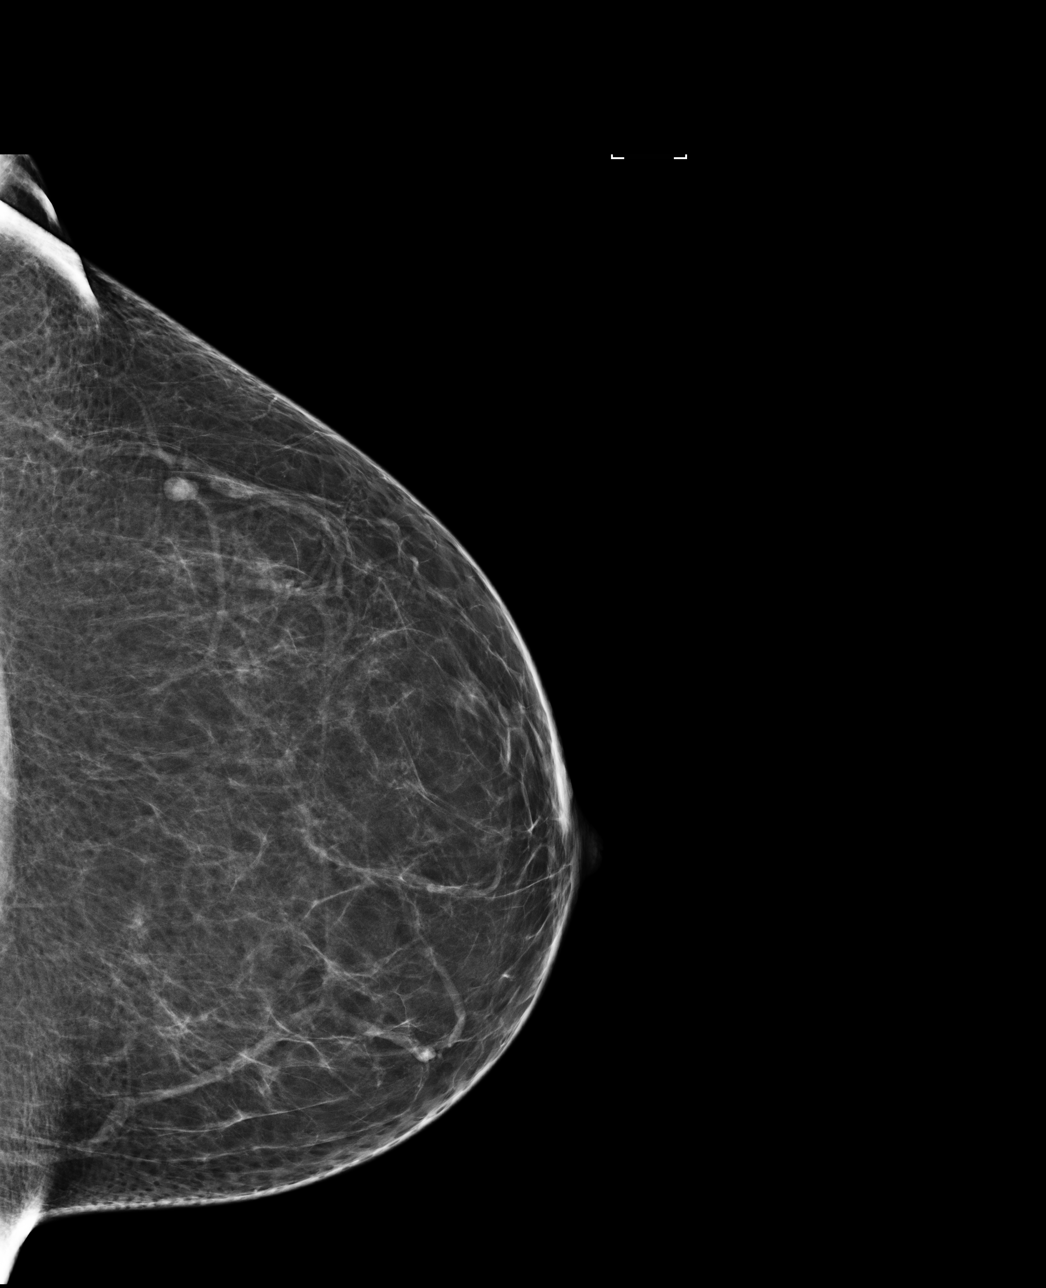

[4 of 4 positions shown; findings below may reference images not displayed]

ACR Breast Density Category c: The breast tissue is heterogeneously
dense, which may obscure small masses
FINDINGS: There are no findings suspicious for malignancy. Images were
processed with CAD.
IMPRESSION: No mammographic evidence of malignancy. A result letter of this
screening mammogram will be mailed directly to the patient.

RECOMMENDATION:
Screening mammogram at age 38. (Code:[id number])

BI-RADS CATEGORY  1: Negative.
